# Patient Record
Sex: Male | Born: 1960 | Race: White | Hispanic: No | Marital: Single | Smoking: Never smoker
Health system: Southern US, Community
[De-identification: ages and names within clinical notes are randomized; demographics above are authoritative.]

## PROBLEM LIST (undated history)

## (undated) DIAGNOSIS — K219 Gastro-esophageal reflux disease without esophagitis: Secondary | ICD-10-CM

## (undated) DIAGNOSIS — K5792 Diverticulitis of intestine, part unspecified, without perforation or abscess without bleeding: Secondary | ICD-10-CM

## (undated) HISTORY — DX: Gastro-esophageal reflux disease without esophagitis: K21.9

## (undated) HISTORY — PX: NASAL SINUS SURGERY: SHX719

## (undated) HISTORY — PX: HERNIA REPAIR: SHX51

---

## 1995-09-04 HISTORY — PX: COLONOSCOPY: SHX174

## 2006-05-08 ENCOUNTER — Emergency Department (HOSPITAL_COMMUNITY): Admission: EM | Admit: 2006-05-08 | Discharge: 2006-05-08 | Payer: Self-pay | Admitting: Emergency Medicine

## 2009-03-26 ENCOUNTER — Emergency Department (HOSPITAL_COMMUNITY): Admission: EM | Admit: 2009-03-26 | Discharge: 2009-03-26 | Payer: Self-pay | Admitting: Emergency Medicine

## 2011-01-15 IMAGING — CR DG FOOT COMPLETE 3+V*L*
3 series · 3 of 3 positions shown · non-contrast
Comparison: None

CLINICAL DATA: Fall.  Great toe pain.

LEFT FOOT - COMPLETE 3+ VIEW

[view not recorded (1 of 3)]
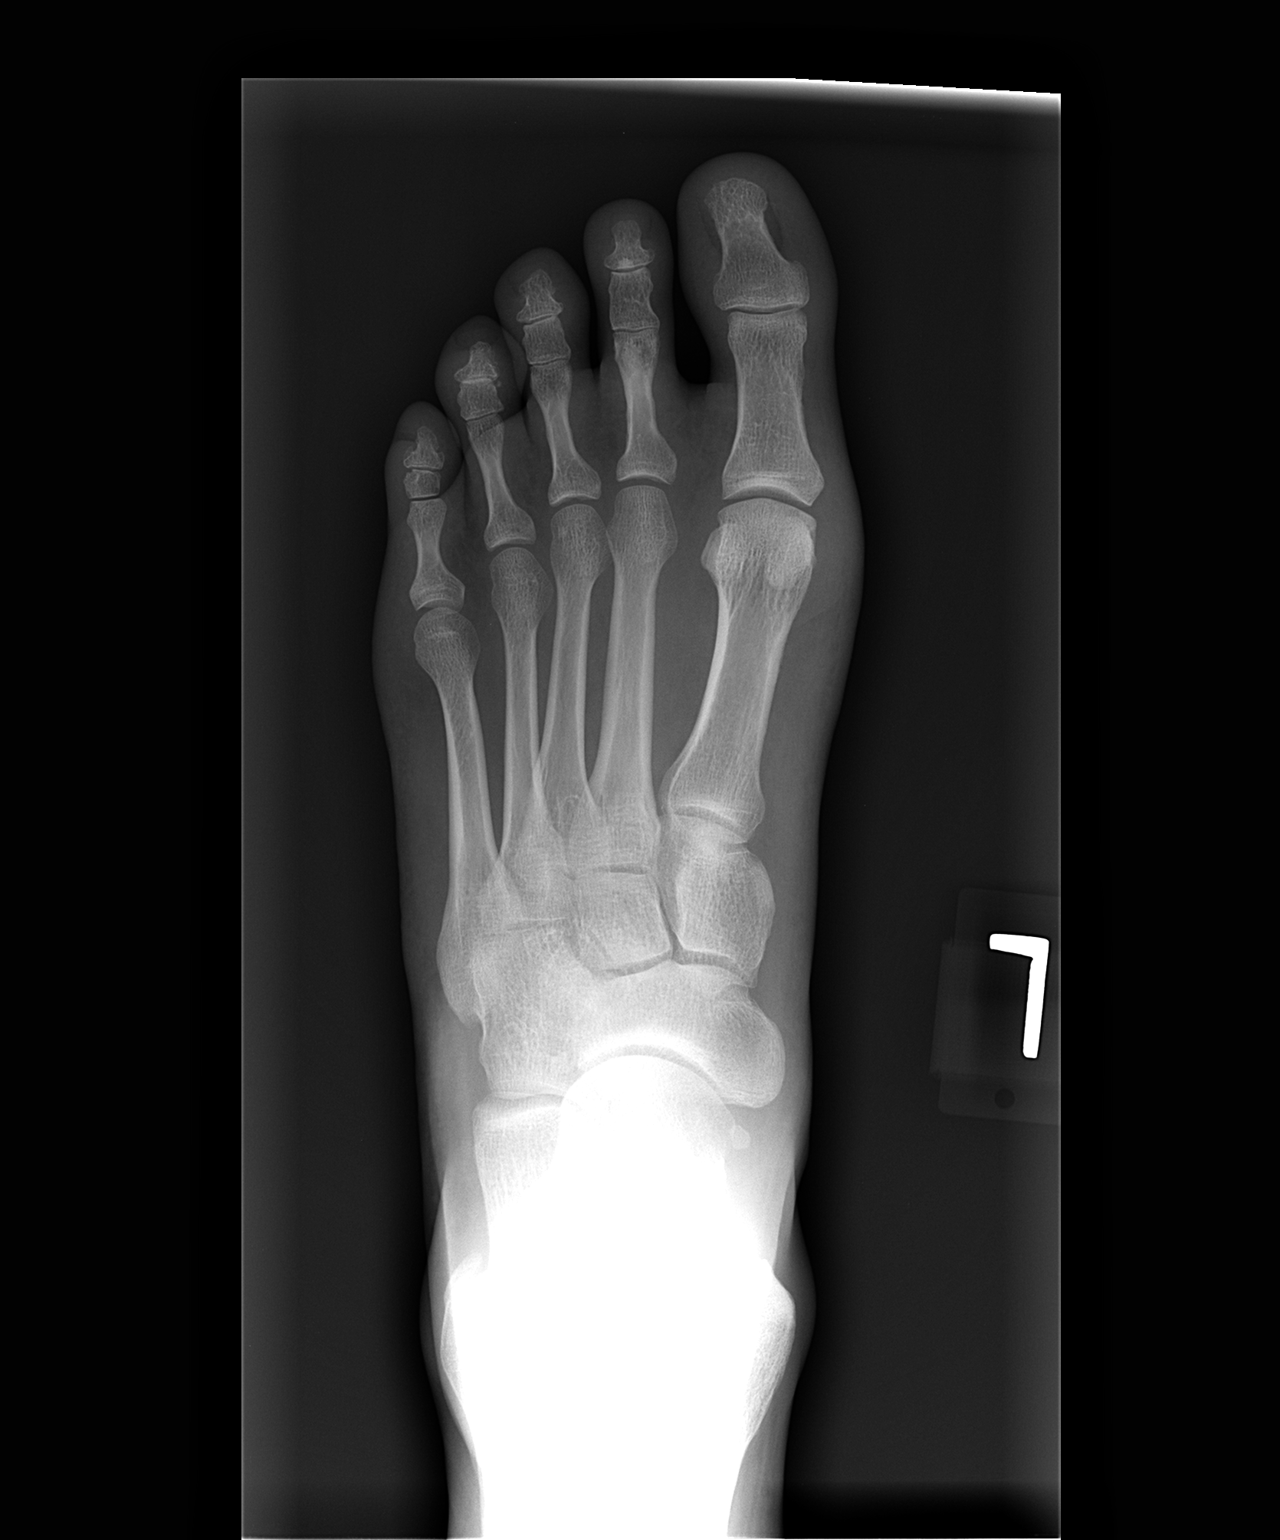

[view not recorded (2 of 3)]
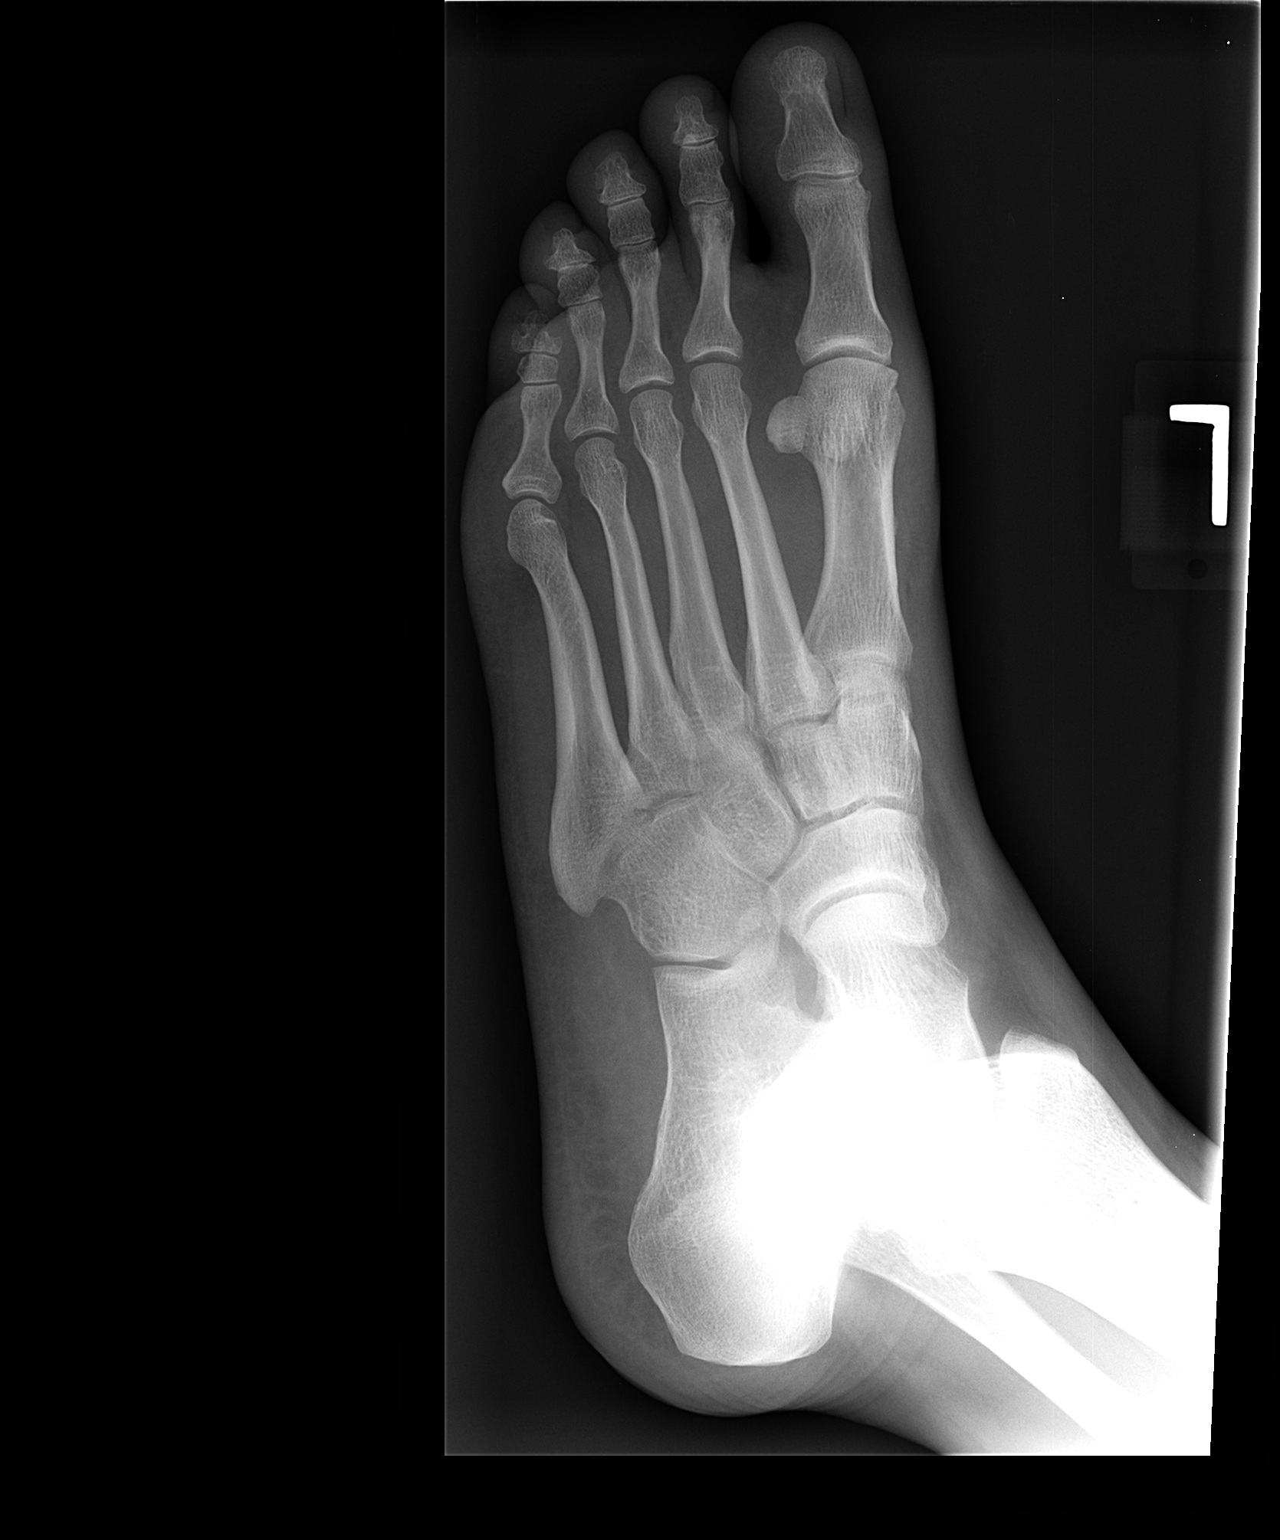

[view not recorded (3 of 3)]
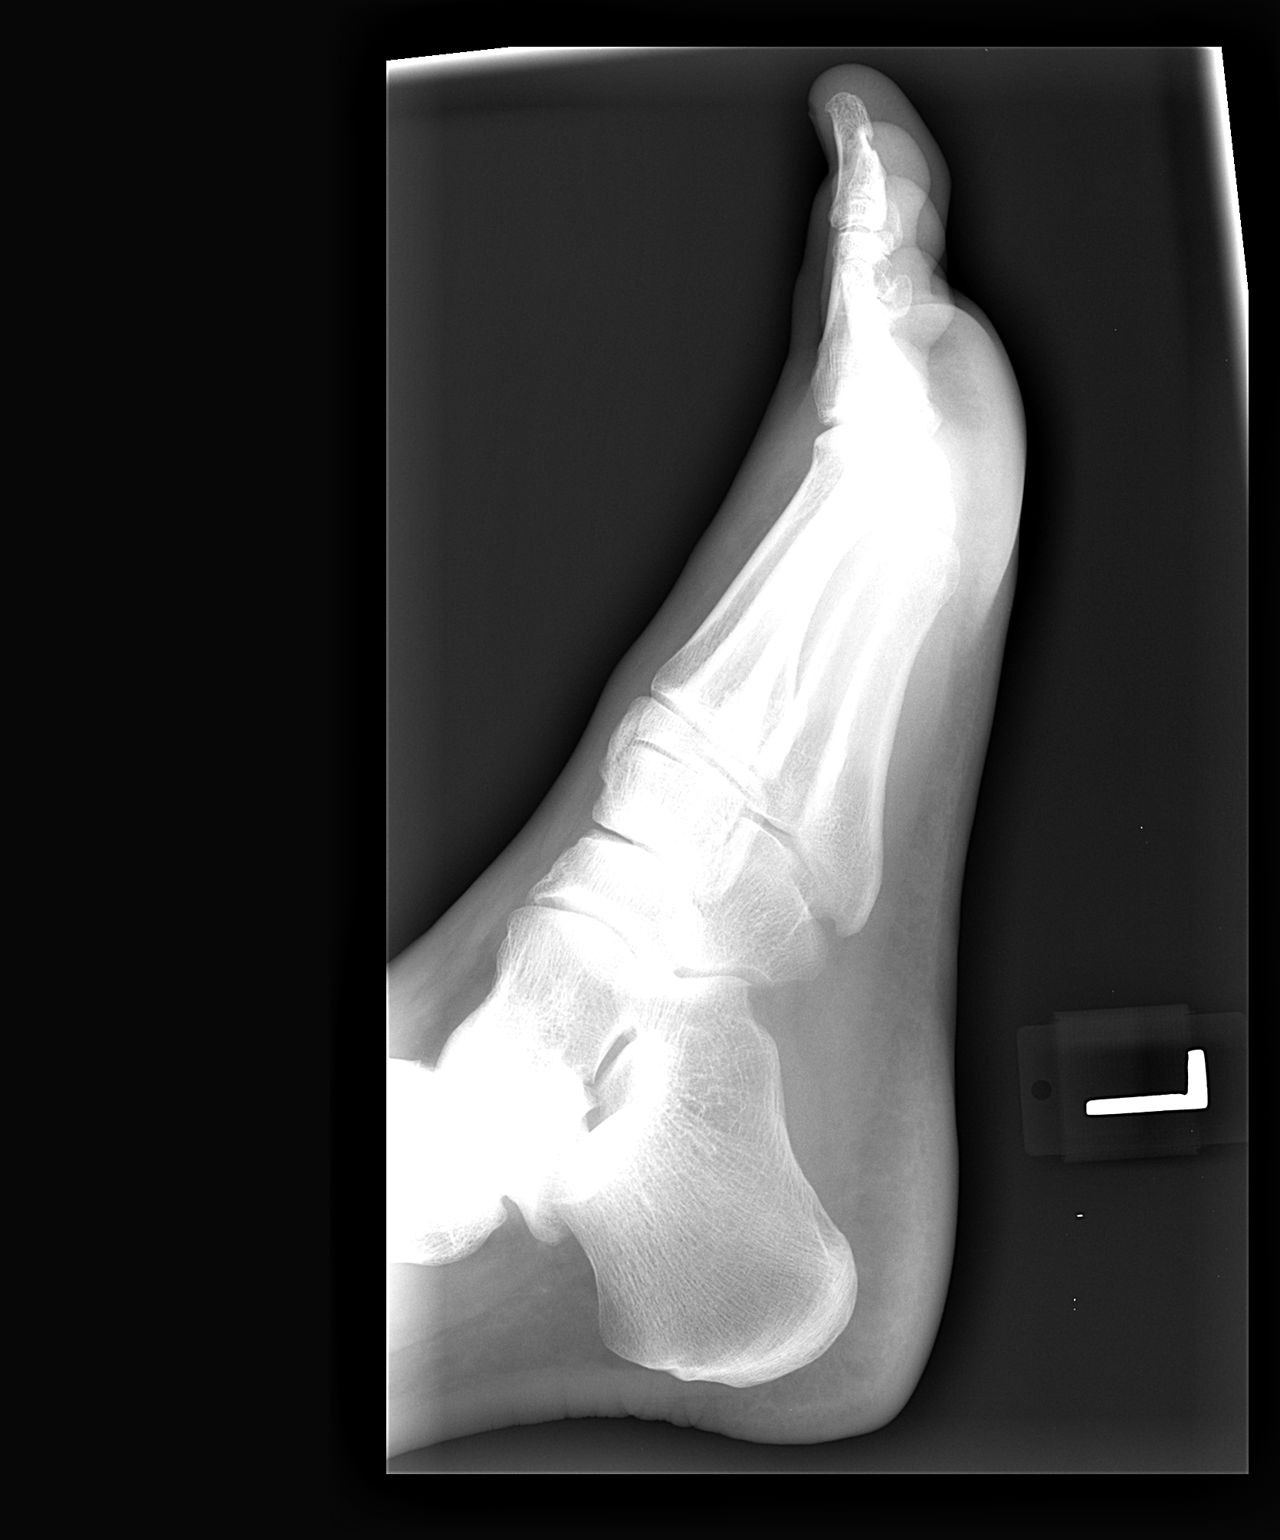

[3 of 3 positions shown; findings below may reference images not displayed]

FINDINGS: No evidence of fracture, dislocation or other focal
abnormality.
IMPRESSION: Negative

## 2013-01-28 ENCOUNTER — Encounter: Payer: Self-pay | Admitting: Gastroenterology

## 2013-02-26 ENCOUNTER — Ambulatory Visit (AMBULATORY_SURGERY_CENTER): Payer: Federal, State, Local not specified - PPO | Admitting: *Deleted

## 2013-02-26 VITALS — Ht 69.0 in | Wt 165.0 lb

## 2013-02-26 DIAGNOSIS — Z1211 Encounter for screening for malignant neoplasm of colon: Secondary | ICD-10-CM

## 2013-02-26 MED ORDER — MOVIPREP 100 G PO SOLR: 1.0000 | Freq: Once | ORAL | Status: DC

## 2013-02-26 NOTE — Progress Notes (Signed)
Denies allergies to eggs or soy products. Denies complications with sedation or anesthesia. 

## 2013-03-02 ENCOUNTER — Encounter: Payer: Self-pay | Admitting: Gastroenterology

## 2013-03-13 ENCOUNTER — Encounter: Payer: Self-pay | Admitting: Gastroenterology

## 2013-03-27 ENCOUNTER — Ambulatory Visit (AMBULATORY_SURGERY_CENTER): Payer: Federal, State, Local not specified - PPO | Admitting: Gastroenterology

## 2013-03-27 ENCOUNTER — Encounter: Payer: Self-pay | Admitting: Gastroenterology

## 2013-03-27 VITALS — BP 106/52 | HR 59 | Temp 96.5°F | Resp 88 | Ht 67.5 in | Wt 165.0 lb

## 2013-03-27 DIAGNOSIS — Z1211 Encounter for screening for malignant neoplasm of colon: Secondary | ICD-10-CM

## 2013-03-27 MED ORDER — SODIUM CHLORIDE 0.9 % IV SOLN: 500.0000 mL | INTRAVENOUS | Status: DC

## 2013-03-27 NOTE — Progress Notes (Signed)
Procedure ends, to recovery, report given and VSS. 

## 2013-03-27 NOTE — Progress Notes (Signed)
NO EGG OR SOY ALLERGY. EMW

## 2013-03-27 NOTE — Op Note (Signed)
[facility] [address] [place], [address]   COLONOSCOPY PROCEDURE REPORT  PATIENT: Jon, Fuentes  MR#: [id number] BIRTHDATE: [birth year] , 52  yrs. old GENDER: Male ENDOSCOPIST: Mardella Layman, MD, Clementeen Graham REFERRED BY:  Doreen Beam, M.D. PROCEDURE DATE:  03/27/2013 PROCEDURE:   Colonoscopy, screening ASA CLASS:   Class I INDICATIONS:average risk screening. MEDICATIONS: propofol (Diprivan) 200mg  IV  DESCRIPTION OF PROCEDURE:   After the risks and benefits and of the procedure were explained, informed consent was obtained.  A digital rectal exam revealed no abnormalities of the rectum.    The LB [id number]  endoscope was introduced through the anus and advanced to the cecum, which was identified by both the appendix and ileocecal valve .  The quality of the prep was excellent, using MoviPrep .  The instrument was then slowly withdrawn as the colon was fully examined.     COLON FINDINGS: A normal appearing cecum, ileocecal valve, and appendiceal orifice were identified.  The ascending, hepatic flexure, transverse, splenic flexure, descending, sigmoid colon and rectum appeared unremarkable.  No polyps or cancers were seen. Retroflexed views revealed no abnormalities.     The scope was then withdrawn from the patient and the procedure completed.  COMPLICATIONS: There were no complications. ENDOSCOPIC IMPRESSION: Normal colon ..no polyps noted.  RECOMMENDATIONS: 1.  Continue current medications 2.  Continue current colorectal screening recommendations for "routine risk" patients with a repeat colonoscopy in 10 years.   REPEAT EXAM:  cc:  _______________________________ eSignedMardella Layman, MD, [facility] 03/27/2013 9:47 AM

## 2013-03-27 NOTE — Progress Notes (Signed)
Patient did not experience any of the following events: a burn prior to discharge; a fall within the facility; wrong site/side/patient/procedure/implant event; or a hospital transfer or hospital admission upon discharge from the facility. (G8907) Patient did not have preoperative order for IV antibiotic SSI prophylaxis. (G8918)  

## 2013-03-27 NOTE — Patient Instructions (Addendum)
Discharge instructions given with verbal understanding. Normal exam. Resume previous medications. YOU HAD AN ENDOSCOPIC PROCEDURE TODAY AT THE [facility]: Refer to the procedure report that was given to you for any specific questions about what was found during the examination.  If the procedure report does not answer your questions, please call your gastroenterologist to clarify.  If you requested that your care partner not be given the details of your procedure findings, then the procedure report has been included in a sealed envelope for you to review at your convenience later.  YOU SHOULD EXPECT: Some feelings of bloating in the abdomen. Passage of more gas than usual.  Walking can help get rid of the air that was put into your GI tract during the procedure and reduce the bloating. If you had a lower endoscopy (such as a colonoscopy or flexible sigmoidoscopy) you may notice spotting of blood in your stool or on the toilet paper. If you underwent a bowel prep for your procedure, then you may not have a normal bowel movement for a few days.  DIET: Your first meal following the procedure should be a light meal and then it is ok to progress to your normal diet.  A half-sandwich or bowl of soup is an example of a good first meal.  Heavy or fried foods are harder to digest and may make you feel nauseous or bloated.  Likewise meals heavy in dairy and vegetables can cause extra gas to form and this can also increase the bloating.  Drink plenty of fluids but you should avoid alcoholic beverages for 24 hours.  ACTIVITY: Your care partner should take you home directly after the procedure.  You should plan to take it easy, moving slowly for the rest of the day.  You can resume normal activity the day after the procedure however you should NOT DRIVE or use heavy machinery for 24 hours (because of the sedation medicines used during the test).    SYMPTOMS TO REPORT IMMEDIATELY: A gastroenterologist  can be reached at any hour.  During normal business hours, 8:30 AM to 5:00 PM Monday through Friday, call [phone].  After hours and on weekends, please call the GI answering service at [phone] who will take a message and have the physician on call contact you.   Following lower endoscopy (colonoscopy or flexible sigmoidoscopy):  Excessive amounts of blood in the stool  Significant tenderness or worsening of abdominal pains  Swelling of the abdomen that is new, acute  Fever of 100F or higher  FOLLOW UP: If any biopsies were taken you will be contacted by phone or by letter within the next 1-3 weeks.  Call your gastroenterologist if you have not heard about the biopsies in 3 weeks.  Our staff will call the home number listed on your records the next business day following your procedure to check on you and address any questions or concerns that you may have at that time regarding the information given to you following your procedure. This is a courtesy call and so if there is no answer at the home number and we have not heard from you through the emergency physician on call, we will assume that you have returned to your regular daily activities without incident.  SIGNATURES/CONFIDENTIALITY: You and/or your care partner have signed paperwork which will be entered into your electronic medical record.  These signatures attest to the fact that that the information above on your After Visit Summary has been reviewed   and is understood.  Full responsibility of the confidentiality of this discharge information lies with you and/or your care-partner. 

## 2013-03-30 ENCOUNTER — Telehealth: Payer: Self-pay

## 2013-03-30 NOTE — Telephone Encounter (Signed)
  Follow up Call-  Call back number 03/27/2013  Post procedure Call Back phone  # [phone]  Permission to leave phone message Yes     Patient questions:  Do you have a fever, pain , or abdominal swelling? no Pain Score  0 *  Have you tolerated food without any problems? yes  Have you been able to return to your normal activities? yes  Do you have any questions about your discharge instructions: Diet   no Medications  no Follow up visit  no  Do you have questions or concerns about your Care? no  Actions: * If pain score is 4 or above: No action needed, pain <4.

## 2018-09-15 ENCOUNTER — Encounter (HOSPITAL_COMMUNITY): Payer: Self-pay | Admitting: Emergency Medicine

## 2018-09-15 ENCOUNTER — Emergency Department (HOSPITAL_COMMUNITY)
Admission: EM | Admit: 2018-09-15 | Discharge: 2018-09-15 | Disposition: A | Discharge status: Home / Self Care | Payer: Federal, State, Local not specified - PPO | Attending: Emergency Medicine | Admitting: Emergency Medicine

## 2018-09-15 ENCOUNTER — Emergency Department (HOSPITAL_COMMUNITY): Payer: Federal, State, Local not specified - PPO

## 2018-09-15 ENCOUNTER — Other Ambulatory Visit: Payer: Self-pay

## 2018-09-15 DIAGNOSIS — K219 Gastro-esophageal reflux disease without esophagitis: Secondary | ICD-10-CM | POA: Diagnosis not present

## 2018-09-15 DIAGNOSIS — R195 Other fecal abnormalities: Secondary | ICD-10-CM

## 2018-09-15 DIAGNOSIS — K529 Noninfective gastroenteritis and colitis, unspecified: Secondary | ICD-10-CM | POA: Diagnosis not present

## 2018-09-15 DIAGNOSIS — R109 Unspecified abdominal pain: Secondary | ICD-10-CM | POA: Diagnosis present

## 2018-09-15 HISTORY — DX: Diverticulitis of intestine, part unspecified, without perforation or abscess without bleeding: K57.92

## 2018-09-15 LAB — COMPREHENSIVE METABOLIC PANEL
ALBUMIN: 4 g/dL (ref 3.5–5.0)
ALK PHOS: 72 U/L (ref 38–126)
ALT: 16 U/L (ref 0–44)
AST: 18 U/L (ref 15–41)
Anion gap: 10 (ref 5–15)
BILIRUBIN TOTAL: 0.8 mg/dL (ref 0.3–1.2)
BUN: 20 mg/dL (ref 6–20)
CO2: 22 mmol/L (ref 22–32)
CREATININE: 1.19 mg/dL (ref 0.61–1.24)
Calcium: 8.9 mg/dL (ref 8.9–10.3)
Chloride: 100 mmol/L (ref 98–111)
GFR calc Af Amer: >60 mL/min (ref >60)
GFR calc non Af Amer: >60 mL/min (ref >60)
GLUCOSE: 100 mg/dL — AB (ref 70–99)
Potassium: 4.3 mmol/L (ref 3.5–5.1)
Sodium: 132 mmol/L — ABNORMAL LOW (ref 135–145)
TOTAL PROTEIN: 7.3 g/dL (ref 6.5–8.1)

## 2018-09-15 LAB — LIPASE, BLOOD: Lipase: 29 U/L (ref 11–51)

## 2018-09-15 LAB — URINALYSIS, ROUTINE W REFLEX MICROSCOPIC
BILIRUBIN URINE: NEGATIVE
Glucose, UA: NEGATIVE mg/dL
HGB URINE DIPSTICK: NEGATIVE
Ketones, ur: 20 mg/dL — AB
Leukocytes, UA: NEGATIVE
Nitrite: NEGATIVE
Protein, ur: NEGATIVE mg/dL
SPECIFIC GRAVITY, URINE: 1.018 (ref 1.005–1.030)
pH: 5 (ref 5.0–8.0)

## 2018-09-15 LAB — CBC
HCT: 51.8 % (ref 39.0–52.0)
HEMOGLOBIN: 16.1 g/dL (ref 13.0–17.0)
MCH: 26.7 pg (ref 26.0–34.0)
MCHC: 31.1 g/dL (ref 30.0–36.0)
MCV: 85.8 fL (ref 80.0–100.0)
PLATELETS: 219 10*3/uL (ref 150–400)
RBC: 6.04 MIL/uL — ABNORMAL HIGH (ref 4.22–5.81)
RDW: 13.3 % (ref 11.5–15.5)
WBC: 9.6 10*3/uL (ref 4.0–10.5)
nRBC: 0 % (ref 0.0–0.2)

## 2018-09-15 LAB — POC OCCULT BLOOD, ED: Fecal Occult Bld: POSITIVE — AB

## 2018-09-15 LAB — TROPONIN I

## 2018-09-15 MED ORDER — CIPROFLOXACIN HCL 500 MG PO TABS: 500.0000 mg | ORAL_TABLET | Freq: Two times a day (BID) | ORAL | 0 refills | Status: AC

## 2018-09-15 MED ORDER — CIPROFLOXACIN HCL 250 MG PO TABS
500.0000 mg | ORAL_TABLET | Freq: Once | ORAL | Status: AC
  Administered 2018-09-15: 500 mg via ORAL
  Filled 2018-09-15: qty 2

## 2018-09-15 MED ORDER — METRONIDAZOLE 500 MG PO TABS
500.0000 mg | ORAL_TABLET | Freq: Once | ORAL | Status: AC
  Administered 2018-09-15: 500 mg via ORAL
  Filled 2018-09-15: qty 1

## 2018-09-15 MED ORDER — METRONIDAZOLE 500 MG PO TABS: 500.0000 mg | ORAL_TABLET | Freq: Two times a day (BID) | ORAL | 0 refills | Status: AC

## 2018-09-15 MED ORDER — IOHEXOL 300 MG/ML  SOLN
100.0000 mL | Freq: Once | INTRAMUSCULAR | Status: AC | PRN
  Administered 2018-09-15: 100 mL via INTRAVENOUS

## 2018-09-15 MED ORDER — ONDANSETRON HCL 4 MG PO TABS: 4.0000 mg | ORAL_TABLET | Freq: Three times a day (TID) | ORAL | 0 refills | Status: AC | PRN

## 2018-09-15 MED ORDER — SODIUM CHLORIDE 0.9 % IV BOLUS
1000.0000 mL | Freq: Once | INTRAVENOUS | Status: AC
  Administered 2018-09-15: 1000 mL via INTRAVENOUS

## 2018-09-15 MED ORDER — ONDANSETRON HCL 4 MG/2ML IJ SOLN
4.0000 mg | Freq: Once | INTRAMUSCULAR | Status: AC
  Administered 2018-09-15: 4 mg via INTRAVENOUS
  Filled 2018-09-15: qty 2

## 2018-09-15 MED ORDER — MORPHINE SULFATE (PF) 4 MG/ML IV SOLN
4.0000 mg | Freq: Once | INTRAVENOUS | Status: AC
  Administered 2018-09-15: 4 mg via INTRAVENOUS
  Filled 2018-09-15: qty 1

## 2018-09-15 NOTE — ED Provider Notes (Signed)
Jon Fuentes   CSN: [id number] Arrival date & time: 09/15/18  1341     History   Chief Complaint Chief Complaint  Patient presents with  . Abdominal Pain    HPI Jon Fuentes is a 58 y.o. male.  He is complaining of abdominal pain and loose stools since Saturday.  He is also had some indigestion and reflux symptoms.  He rates the pain as 7 out of 10 mostly lower abdomen but there is also some upper abdominal burning discomfort.  No fevers or chills.  A little bit of nausea no vomiting.  Says his stools have been dark.  No urinary symptoms.  No recent travel no sick contacts.  He went to his PCP today who referred him here for evaluation.  The history is provided by the patient.  Abdominal Pain  Pain location:  Epigastric Pain quality: aching   Pain radiates to:  Does not radiate Pain severity now: 8/10. Onset quality:  Gradual Timing:  Constant Progression:  Unchanged Chronicity:  New Context: not sick contacts and not suspicious food intake   Relieved by:  Nothing Worsened by:  Nothing Ineffective treatments:  OTC medications Associated symptoms: diarrhea and nausea   Associated symptoms: no chest pain, no cough, no dysuria, no fever, no hematemesis, no hematochezia, no hematuria, no melena, no shortness of breath, no sore throat and no vomiting   Diarrhea:    Quality:  Semi-solid   Number of occurrences:  5   Timing:  Intermittent   Progression:  Improving   Past Medical History:  Diagnosis Date  . Diverticulitis   . GERD (gastroesophageal reflux disease)     There are no active problems to display for this patient.   Past Surgical History:  Procedure Laterality Date  . COLONOSCOPY  1997  . NASAL SINUS SURGERY          Home Medications    Prior to Admission medications   Medication Sig Start Date End Date Taking? Authorizing Provider  omeprazole (PRILOSEC) 40 MG capsule Take 40 mg by mouth as needed.    [provider]    Family History Family History  Problem Relation Age of Onset  . Colon cancer Neg Hx   . Esophageal cancer Neg Hx   . Rectal cancer Neg Hx   . Stomach cancer Neg Hx     Social History Social History   Tobacco Use  . Smoking status: Never Smoker  . Smokeless tobacco: Former Engineer, waterUser  Substance Use Topics  . Alcohol use: Yes    Alcohol/week: 4.0 standard drinks    Types: 4 Glasses of wine per week  . Drug use: No     Allergies   Patient has no known allergies.   Review of Systems Review of Systems  Constitutional: Negative for fever.  HENT: Negative for sore throat.   Eyes: Negative for visual disturbance.  Respiratory: Negative for cough and shortness of breath.   Cardiovascular: Negative for chest pain.  Gastrointestinal: Positive for abdominal pain, diarrhea and nausea. Negative for hematemesis, hematochezia, melena and vomiting.  Genitourinary: Negative for dysuria and hematuria.  Musculoskeletal: Negative for back pain.  Skin: Negative for rash.  Neurological: Negative for headaches.     Physical Exam Updated Vital Signs BP (!) 129/94 (BP Location: Right Arm)   Pulse (!) 102   Temp 97.9 F (36.6 C) (Temporal)   Resp 16   Ht 5\' 9"  (1.753 m)   Wt 79.4 kg  SpO2 97%   BMI 25.84 kg/m   Physical Exam Vitals signs and nursing Fuentes reviewed.  Constitutional:      Appearance: He is well-developed.  HENT:     Head: Normocephalic and atraumatic.  Eyes:     Conjunctiva/sclera: Conjunctivae normal.  Neck:     Musculoskeletal: Neck supple.  Cardiovascular:     Rate and Rhythm: Normal rate and regular rhythm.     Heart sounds: No murmur.  Pulmonary:     Effort: Pulmonary effort is normal. No respiratory distress.     Breath sounds: Normal breath sounds.  Abdominal:     General: Abdomen is flat.     Palpations: Abdomen is soft. There is no mass.     Tenderness: There is abdominal tenderness in the periumbilical area and left lower  quadrant. There is no guarding or rebound.  Musculoskeletal: Normal range of motion.        General: No swelling, tenderness or signs of injury.  Skin:    General: Skin is warm and dry.     Capillary Refill: Capillary refill takes less than 2 seconds.  Neurological:     General: No focal deficit present.     Mental Status: He is alert.      ED Treatments / Results  Labs (all labs ordered are listed, but only abnormal results are displayed) Labs Reviewed  COMPREHENSIVE METABOLIC PANEL - Abnormal; Notable for the following components:      Result Value   Sodium 132 (*)    Glucose, Bld 100 (*)    All other components within normal limits  CBC - Abnormal; Notable for the following components:   RBC 6.04 (*)    All other components within normal limits  URINALYSIS, ROUTINE W REFLEX MICROSCOPIC - Abnormal; Notable for the following components:   Ketones, ur 20 (*)    All other components within normal limits  POC OCCULT BLOOD, ED - Abnormal; Notable for the following components:   Fecal Occult Bld POSITIVE (*)    All other components within normal limits  LIPASE, BLOOD  TROPONIN I    EKG EKG Interpretation  Date/Time:  Monday September 15 2018 18:02:15 EST Ventricular Rate:  95 PR Interval:    QRS Duration: 97 QT Interval:  343 QTC Calculation: 432 R Axis:   -24 Text Interpretation:  Sinus rhythm Borderline left axis deviation Abnormal R-wave progression, late transition no prior to compare with Confirmed by Meridee Score [phone]) on 09/15/2018 6:14:17 PM   Radiology Ct Abdomen Pelvis W Contrast  Result Date: 09/15/2018 CLINICAL DATA:  Left lower quadrant abdominal pain for 2 days. Diarrhea. EXAM: CT ABDOMEN AND PELVIS WITH CONTRAST TECHNIQUE: Multidetector CT imaging of the abdomen and pelvis was performed using the standard protocol following bolus administration of intravenous contrast. CONTRAST:  OMNIPAQUE IOHEXOL 300 MG/ML  SOLN COMPARISON:  None. FINDINGS: Lower  chest: Dependent atelectasis in the lung bases. Hepatobiliary: No focal liver abnormality is seen. No gallstones, gallbladder wall thickening, or biliary dilatation. Pancreas: Unremarkable. No pancreatic ductal dilatation or surrounding inflammatory changes. Spleen: Normal in size without focal abnormality. Adrenals/Urinary Tract: Adrenal glands are unremarkable. Kidneys are normal, without renal calculi, focal lesion, or hydronephrosis. Bladder is unremarkable. Stomach/Bowel: Stomach is decompressed. Mildly dilated fluid-filled small bowel without transition zone. Mild diffuse small bowel wall thickening. Changes suggest enteritis, probably infectious or inflammatory. No pneumatosis or portal venous gas. Mesenteric vein and artery appear patent. Scattered stool throughout the colon. No colonic distention or wall thickening.  No evidence of diverticulitis. Appendix is normal. Vascular/Lymphatic: No significant vascular findings are present. No enlarged abdominal or pelvic lymph nodes. Reproductive: Prostate gland is enlarged, measuring 4.8 cm diameter. Other: No free air or free fluid in the abdomen. Abdominal wall musculature appears intact. Musculoskeletal: No acute or significant osseous findings. IMPRESSION: Mildly dilated fluid-filled small bowel with diffuse small bowel wall thickening. Changes likely to represent enteritis, probably infectious or inflammatory. No evidence of obstruction. Prostate gland is enlarged. Electronically Signed   By: Burman Nieves M.D.   On: 09/15/2018 19:38    Procedures Procedures (including critical care time)  Medications Ordered in ED Medications  morphine 4 MG/ML injection 4 mg (4 mg Intravenous Given 09/15/18 1814)  ondansetron (ZOFRAN) injection 4 mg (4 mg Intravenous Given 09/15/18 1814)  sodium chloride 0.9 % bolus 1,000 mL (0 mLs Intravenous Stopped 09/15/18 1937)  iohexol (OMNIPAQUE) 300 MG/ML solution 100 mL (100 mLs Intravenous Contrast Given 09/15/18 1908)    ciprofloxacin (CIPRO) tablet 500 mg (500 mg Oral Given 09/15/18 2032)  metroNIDAZOLE (FLAGYL) tablet 500 mg (500 mg Oral Given 09/15/18 2032)     Initial Impression / Assessment and Plan / ED Course  I have reviewed the triage vital signs and the nursing notes.  Pertinent labs & imaging results that were available during my care of the patient were reviewed by me and considered in my medical decision making (see chart for details).  Clinical Course as of Sep 16 1832  Mon Sep 15, 2018  2014 Chaperone was present during exam. Rectal exam normal sphincter tone no masses.  No particular stool in vault but was trace guaiac positive.  His CAT scan was read as enteritis.  He is got a normal hemoglobin and normal white count.   [MB]  2015 Think would be reasonable to cover him with antibiotics and give him GI for follow-up.  He says he thinks he had enteritis about 4 years ago at [facility] that did not have an explanation either.   [MB]    Clinical Course User Index [MB] Terrilee Files, MD    Final Clinical Impressions(s) / ED Diagnoses   Final diagnoses:  Enteritis  Guaiac positive stools    ED Discharge Orders         Ordered    ciprofloxacin (CIPRO) 500 MG tablet  2 times daily     09/15/18 2019    metroNIDAZOLE (FLAGYL) 500 MG tablet  2 times daily     09/15/18 2019    ondansetron (ZOFRAN) 4 MG tablet  Every 8 hours PRN     09/15/18 2019           Terrilee Files, MD 09/16/18 [phone]

## 2018-09-15 NOTE — Discharge Instructions (Signed)
You were seen in the emergency department for diarrhea and abdominal pain.  Your lab work was unremarkable but your CAT scan showed some small bowel thickening consistent with an enteritis.  Your rectal exam was also positive for trace blood.  We are starting you on 2 antibiotics that you will need to finish.  Please do not use alcohol when taking these medicines.  Keep well-hydrated.  Please follow-up with your doctor and we are also giving the number for a gastroenterologist in town.  Return if any worsening symptoms.

## 2018-09-15 NOTE — ED Triage Notes (Signed)
Pt reports D/ for 3 days, once today and abd pain for 2 days. Hx of diverticulitis.

## 2020-07-06 IMAGING — CT CT ABD-PELV W/ CM
2 of 5 series · 15 of 46 positions shown, 17 images · IV contrast (Isovue)
Comparison: None.

CLINICAL DATA: Left lower quadrant abdominal pain for 2 days.
Diarrhea.

EXAM:
CT ABDOMEN AND PELVIS WITH CONTRAST
TECHNIQUE: Multidetector CT imaging of the abdomen and pelvis was performed
using the standard protocol following bolus administration of
intravenous contrast.
CONTRAST:  100mL OMNIPAQUE IOHEXOL 300 MG/ML  SOLN

[Series 2: axial st · axial · 0.74mm/px · z∈[-387,+83]mm · 12 of 106 slices shown, 14 images]
[im 6/106  soft-tissue]
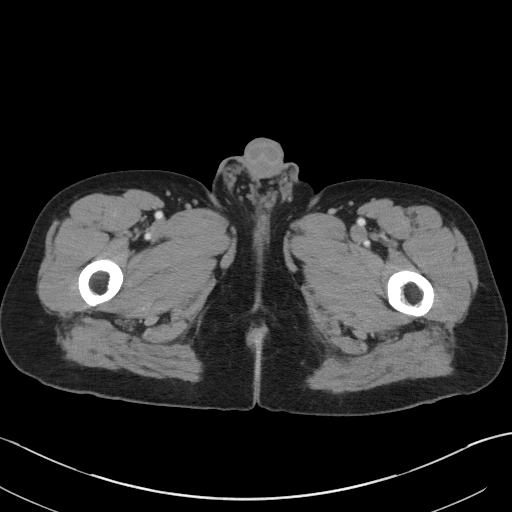
[im 6/106  bone]
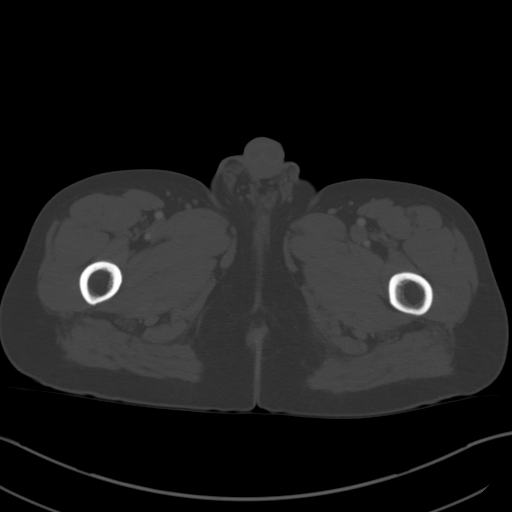
[im 16/106  soft-tissue]
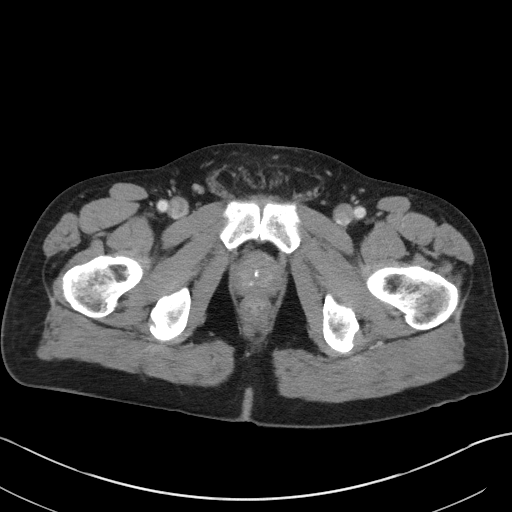
[im 22/106  soft-tissue]
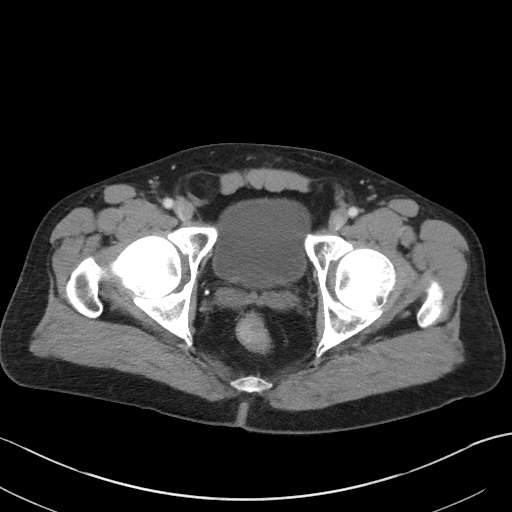
[im 32/106  soft-tissue]
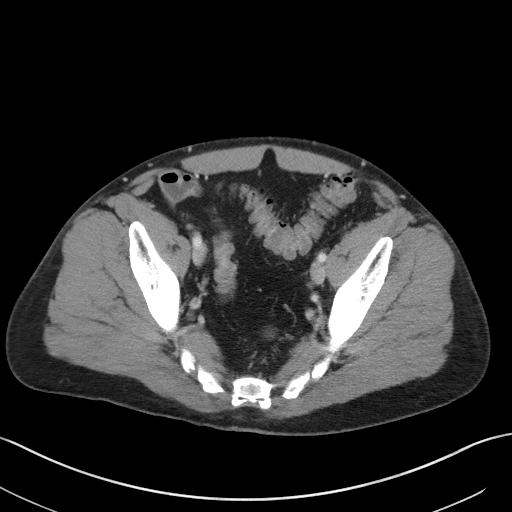
[im 43/106  soft-tissue]
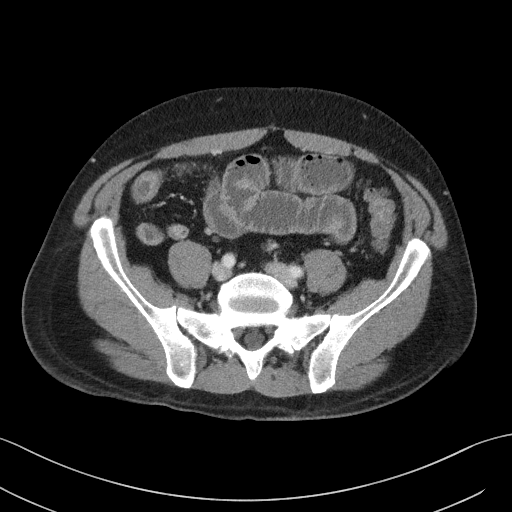
[im 48/106  soft-tissue]
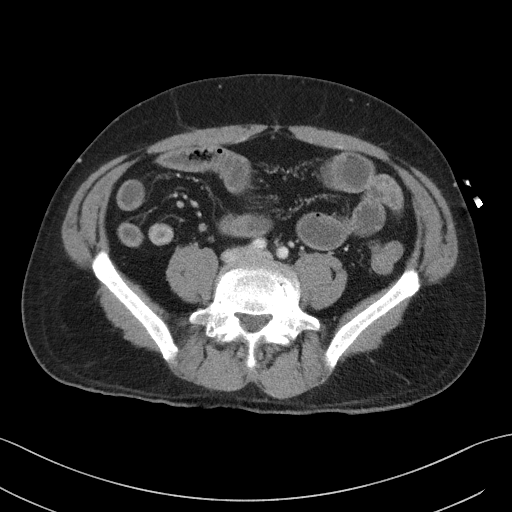
[im 58/106  soft-tissue]
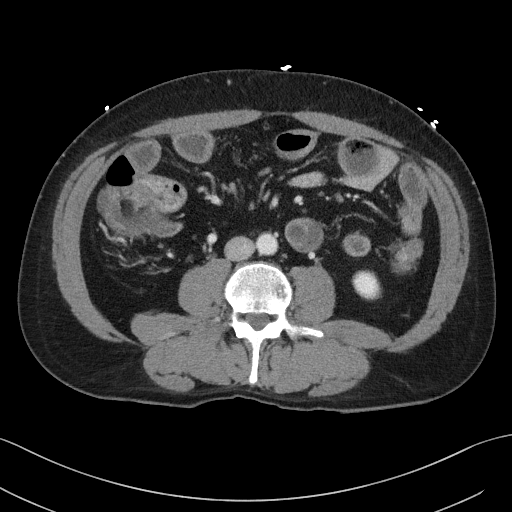
[im 64/106  soft-tissue]
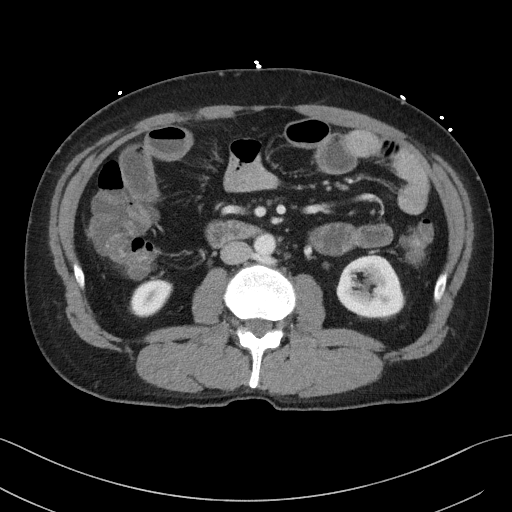
[im 74/106  soft-tissue]
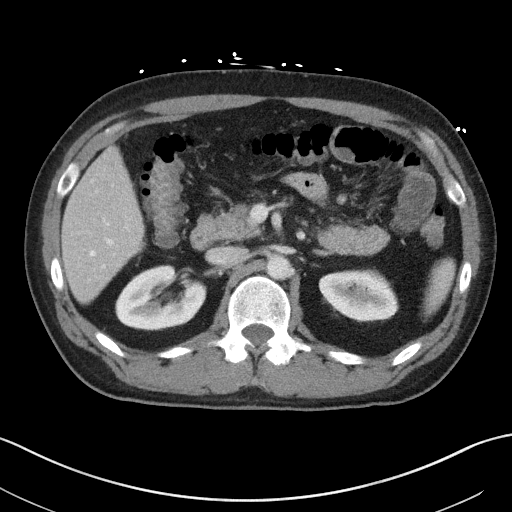
[im 74/106  bone]
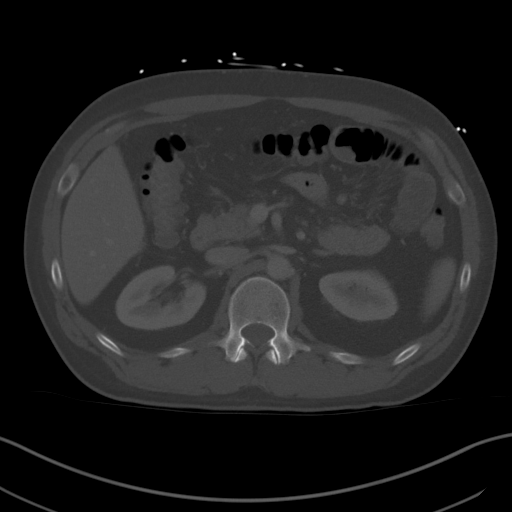
[im 85/106  soft-tissue]
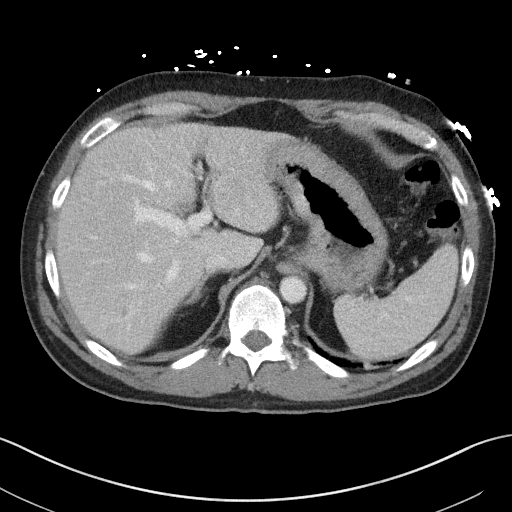
[im 90/106  soft-tissue]
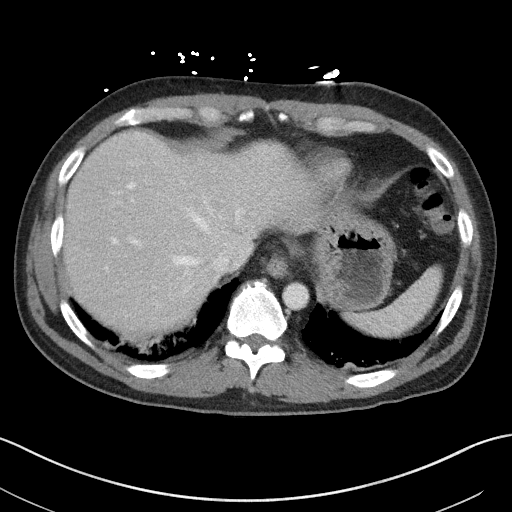
[im 100/106  soft-tissue]
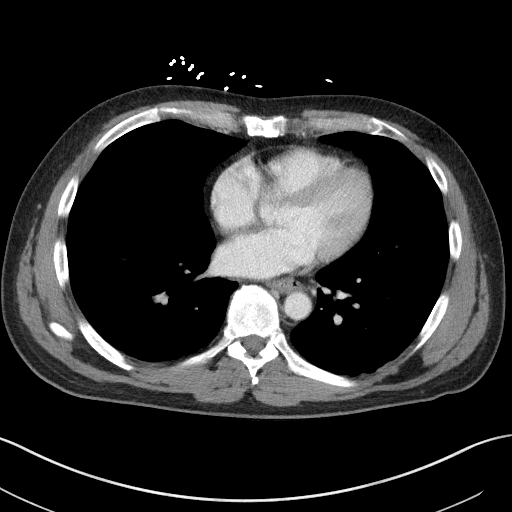

[Series 6: coronal st · coronal · 0.66mm/px · 3 of 92 slices shown]
[im 31/92  soft-tissue]
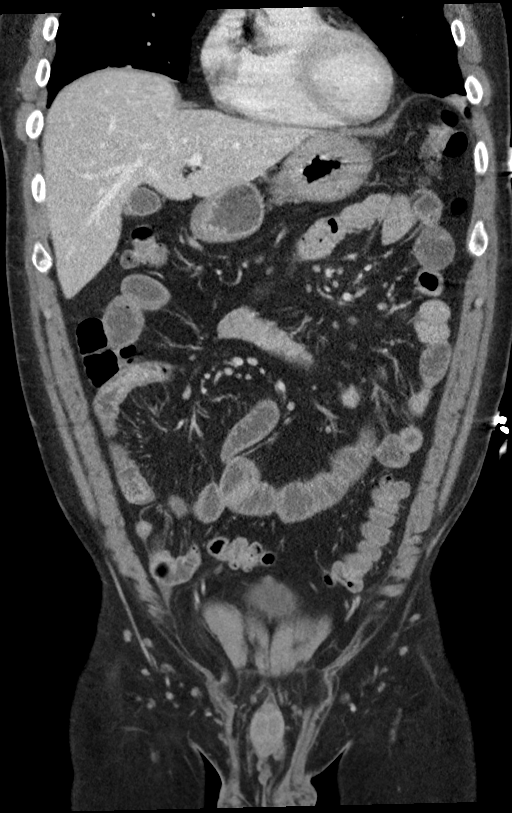
[im 41/92  soft-tissue]
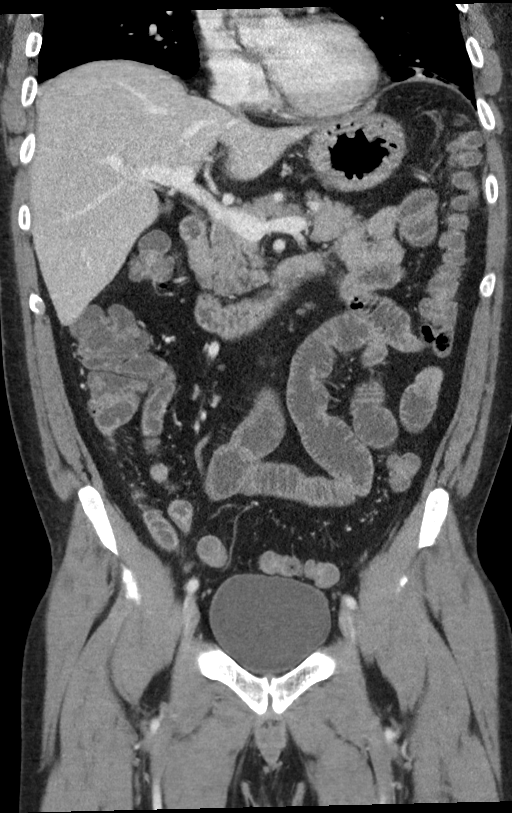
[im 51/92  soft-tissue]
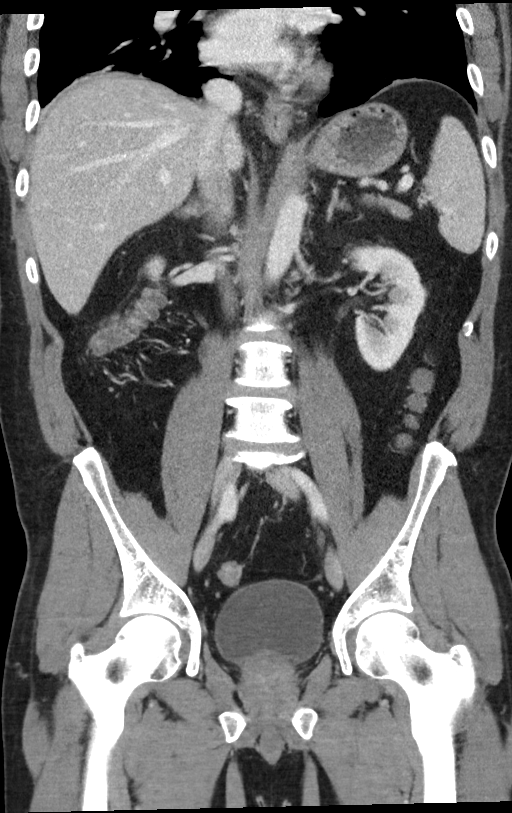

[15 of 46 positions shown; findings below may reference images not displayed]

FINDINGS: Lower chest: Dependent atelectasis in the lung bases.

Hepatobiliary: No focal liver abnormality is seen. No gallstones,
gallbladder wall thickening, or biliary dilatation.

Pancreas: Unremarkable. No pancreatic ductal dilatation or
surrounding inflammatory changes.

Spleen: Normal in size without focal abnormality.

Adrenals/Urinary Tract: Adrenal glands are unremarkable. Kidneys are
normal, without renal calculi, focal lesion, or hydronephrosis.
Bladder is unremarkable.

Stomach/Bowel: Stomach is decompressed. Mildly dilated fluid-filled
small bowel without transition zone. Mild diffuse small bowel wall
thickening. Changes suggest enteritis, probably infectious or
inflammatory. No pneumatosis or portal venous gas. Mesenteric vein
and artery appear patent. Scattered stool throughout the colon. No
colonic distention or wall thickening. No evidence of
diverticulitis. Appendix is normal.

Vascular/Lymphatic: No significant vascular findings are present. No
enlarged abdominal or pelvic lymph nodes.

Reproductive: Prostate gland is enlarged, measuring 4.8 cm diameter.

Other: No free air or free fluid in the abdomen. Abdominal wall
musculature appears intact.

Musculoskeletal: No acute or significant osseous findings.
IMPRESSION: Mildly dilated fluid-filled small bowel with diffuse small bowel
wall thickening. Changes likely to represent enteritis, probably
infectious or inflammatory. No evidence of obstruction. Prostate
gland is enlarged.

## 2020-09-09 ENCOUNTER — Encounter (HOSPITAL_COMMUNITY): Payer: Self-pay

## 2020-09-09 ENCOUNTER — Emergency Department (HOSPITAL_COMMUNITY)
Admission: EM | Admit: 2020-09-09 | Discharge: 2020-09-09 | Disposition: A | Discharge status: Home / Self Care | Payer: Federal, State, Local not specified - PPO | Attending: Emergency Medicine | Admitting: Emergency Medicine

## 2020-09-09 ENCOUNTER — Other Ambulatory Visit: Payer: Self-pay

## 2020-09-09 DIAGNOSIS — F17220 Nicotine dependence, chewing tobacco, uncomplicated: Secondary | ICD-10-CM | POA: Insufficient documentation

## 2020-09-09 DIAGNOSIS — R39198 Other difficulties with micturition: Secondary | ICD-10-CM

## 2020-09-09 DIAGNOSIS — R339 Retention of urine, unspecified: Secondary | ICD-10-CM | POA: Diagnosis present

## 2020-09-09 NOTE — Discharge Instructions (Addendum)
If at any point you again feel unable to empty your bladder, develop fevers, have large amounts of blood in your urine or have any other concerns please seek additional medical care and evaluation.

## 2020-09-09 NOTE — ED Notes (Signed)
Pt states he has been able to urinate twice

## 2020-09-09 NOTE — ED Notes (Signed)
Spouse w pt

## 2020-09-09 NOTE — ED Triage Notes (Signed)
Pt to er, pt states that he had a bilateral hernia repair today in [place], states that he is here because he can't urinate, states that he has only been able to have a few drips and feels like he really needs to go, states that his last void was this am, but he had a catheter for surgery.

## 2020-09-09 NOTE — ED Notes (Signed)
Pt had bilateral hernia repair this morning in [place]   Abd swelling a feeling of inability to void thruout the day   Dance movement psychotherapist. Who suggested an ER visit   Bladder scan shows 22 cc in bladder

## 2020-09-09 NOTE — ED Provider Notes (Signed)
[facility] EMERGENCY DEPARTMENT Provider Note   CSN: [id number] Arrival date & time: 09/09/20  2033     History Chief Complaint  Patient presents with  . Urinary Retention    Jon Fuentes is a 60 y.o. male who presents today for evaluation of concern for urinary retention.  At [facility] earlier today he had robotic bilateral inguinal hernia repair.  Review of records from that shows he had a Foley catheter at that point.  He states that since he is discharged home he has been unable to urinate.  He states that he feels a lot of pressure over his lower abdomen.  He states that he is able to get a few drips out however nothing significant.  He denies any fevers or other complaints or concerns.  Per RN bladder scan shows under 100 cc of urine.  HPI     Past Medical History:  Diagnosis Date  . Diverticulitis   . GERD (gastroesophageal reflux disease)     There are no problems to display for this patient.   Past Surgical History:  Procedure Laterality Date  . COLONOSCOPY  1997  . HERNIA REPAIR    . NASAL SINUS SURGERY         Family History  Problem Relation Age of Onset  . Colon cancer Neg Hx   . Esophageal cancer Neg Hx   . Rectal cancer Neg Hx   . Stomach cancer Neg Hx     Social History   Tobacco Use  . Smoking status: Never Smoker  . Smokeless tobacco: Former Clinical biochemist  . Vaping Use: Never used  Substance Use Topics  . Alcohol use: Yes    Alcohol/week: 4.0 standard drinks    Types: 4 Glasses of wine per week  . Drug use: No    Home Medications Prior to Admission medications   Medication Sig Start Date End Date Taking? Authorizing Provider  diclofenac Sodium (VOLTAREN) 1 % GEL Place onto the skin. 08/22/18  Yes [provider]  HYDROcodone-acetaminophen (NORCO/VICODIN) 5-325 MG tablet Take 1 tablet by mouth every 6 (six) hours as needed. 09/09/20  Yes [provider]  methocarbamol (ROBAXIN) 500 MG tablet Take 500 mg by mouth 3  (three) times daily. 09/09/20 09/14/20 Yes [provider]  Multiple Vitamins-Minerals (ONE-A-DAY MENS 50+ ADVANTAGE PO) Take 1 tablet by mouth daily.   Yes [provider]  omeprazole (PRILOSEC) 40 MG capsule Take 40 mg by mouth as needed.   Yes [provider]  polyethylene glycol powder (GLYCOLAX/MIRALAX) 17 GM/SCOOP powder Take by mouth. 09/09/20 09/12/20 Yes [provider]  cephALEXin (KEFLEX) 500 MG capsule Take 500 mg by mouth 3 (three) times daily. Patient not taking: Reported on 09/09/2020 08/04/20   [provider]  ciprofloxacin (CIPRO) 500 MG tablet Take 1 tablet (500 mg total) by mouth 2 (two) times daily. Patient not taking: Reported on 09/09/2020 09/15/18   Terrilee Files, MD  metroNIDAZOLE (FLAGYL) 500 MG tablet Take 1 tablet (500 mg total) by mouth 2 (two) times daily. Patient not taking: Reported on 09/09/2020 09/15/18   Terrilee Files, MD  ondansetron (ZOFRAN) 4 MG tablet Take 1 tablet (4 mg total) by mouth every 8 (eight) hours as needed for nausea or vomiting. 09/15/18   Terrilee Files, MD    Allergies    Patient has no known allergies.  Review of Systems   Review of Systems  Constitutional: Negative for chills and fever.  Respiratory: Negative for  shortness of breath.   Cardiovascular: Negative for chest pain.  Gastrointestinal: Positive for abdominal pain (Mild, discomfort and feelings of fullness).  Genitourinary: Positive for difficulty urinating. Negative for dysuria, frequency, penile pain and testicular pain.  Musculoskeletal: Negative for back pain and neck pain.  Neurological: Negative for weakness and headaches.  All other systems reviewed and are negative.   Physical Exam Updated Vital Signs BP 135/89 (BP Location: Right Arm)   Pulse 67   Temp 97.9 F (36.6 C) (Oral)   Resp 17   Ht 5\' 9"  (1.753 m)   Wt 81.6 kg   SpO2 98%   BMI 26.58 kg/m   Physical Exam Vitals and nursing note reviewed.  Constitutional:       General: He is not in acute distress.    Appearance: He is not diaphoretic.  HENT:     Head: Normocephalic and atraumatic.  Eyes:     General: No scleral icterus.       Right eye: No discharge.        Left eye: No discharge.     Conjunctiva/sclera: Conjunctivae normal.  Cardiovascular:     Rate and Rhythm: Normal rate and regular rhythm.  Pulmonary:     Effort: Pulmonary effort is normal. No respiratory distress.     Breath sounds: No stridor.  Abdominal:     General: There is no distension.     Tenderness: There is no abdominal tenderness. There is no guarding.     Comments: Incisions that were visualized are CDI.   Genitourinary:    Comments: Deferred, patient in the hallway and reports his symptoms have resolved Musculoskeletal:        General: No deformity.     Cervical back: Normal range of motion and neck supple.  Skin:    General: Skin is warm and dry.  Neurological:     Mental Status: He is alert.     Motor: No abnormal muscle tone.  Psychiatric:        Behavior: Behavior normal.     ED Results / Procedures / Treatments   Labs (all labs ordered are listed, but only abnormal results are displayed) Labs Reviewed - No data to display  EKG None  Radiology No results found.  Procedures Procedures (including critical care time)  Medications Ordered in ED Medications - No data to display  ED Course  I have reviewed the triage vital signs and the nursing notes.  Pertinent labs & imaging results that were available during my care of the patient were reviewed by me and considered in my medical decision making (see chart for details).    MDM Rules/Calculators/A&P                         Patient is a 60 year old man who presents today for evaluation of feelings of urinary retention. He had robotic bilateral inguinal hernia repair at [facility] earlier today where he had a catheter placed during surgery and feels like he has been unable to urinate. RN perform  bladder scan and reports under 100 cc of fluid. At the time of my evaluation patient reports that he has been able to urinate about a cup of urine twice and feels much better. I did offer him bedside ultrasound to further evaluate for bladder distention, especially as given he is recently postoperative bladder scan may be less accurate as he has reportedly urinated out more than 100 mL of fluid. Patient stated that he  felt better and declined this stating that he wished to go home instead at this time.    Return precautions were discussed with patient who states their understanding.  At the time of discharge patient denied any unaddressed complaints or concerns.  Patient is agreeable for discharge home.  Note: Portions of this report may have been transcribed using voice recognition software. Every effort was made to ensure accuracy; however, inadvertent computerized transcription errors may be present  Final Clinical Impression(s) / ED Diagnoses Final diagnoses:  Difficulty urinating    Rx / DC Orders ED Discharge Orders    None       Lorin Glass, PA-C 09/10/20 3009    Fredia Sorrow, MD 09/11/20 2141

## 2020-09-09 NOTE — ED Notes (Signed)
abd swollen and tender to touch   Slight bruising to umbilicus area   With closed incision - edges well approximated

## 2023-07-09 ENCOUNTER — Other Ambulatory Visit (HOSPITAL_COMMUNITY): Payer: Self-pay | Admitting: Orthopedic Surgery

## 2023-07-09 DIAGNOSIS — G8929 Other chronic pain: Secondary | ICD-10-CM

## 2023-07-16 ENCOUNTER — Ambulatory Visit (HOSPITAL_COMMUNITY)
Admission: RE | Admit: 2023-07-16 | Discharge: 2023-07-16 | Disposition: A | Discharge status: Home / Self Care | Payer: Federal, State, Local not specified - PPO | Source: Ambulatory Visit | Attending: Orthopedic Surgery | Admitting: Orthopedic Surgery

## 2023-07-16 DIAGNOSIS — G8929 Other chronic pain: Secondary | ICD-10-CM | POA: Diagnosis present

## 2023-07-16 DIAGNOSIS — M25561 Pain in right knee: Secondary | ICD-10-CM | POA: Diagnosis present

## 2023-07-16 DIAGNOSIS — M25562 Pain in left knee: Secondary | ICD-10-CM | POA: Diagnosis present

## 2024-02-11 ENCOUNTER — Other Ambulatory Visit (HOSPITAL_COMMUNITY): Payer: Self-pay | Admitting: Gastroenterology

## 2024-02-11 DIAGNOSIS — R1084 Generalized abdominal pain: Secondary | ICD-10-CM

## 2024-02-14 ENCOUNTER — Ambulatory Visit (HOSPITAL_COMMUNITY)
Admission: RE | Admit: 2024-02-14 | Discharge: 2024-02-14 | Disposition: A | Discharge status: Home / Self Care | Source: Ambulatory Visit | Attending: Gastroenterology | Admitting: Gastroenterology

## 2024-02-14 DIAGNOSIS — R1084 Generalized abdominal pain: Secondary | ICD-10-CM | POA: Insufficient documentation

## 2024-02-14 MED ORDER — IOHEXOL 300 MG/ML  SOLN
100.0000 mL | Freq: Once | INTRAMUSCULAR | Status: AC | PRN
  Administered 2024-02-14: 100 mL via INTRAVENOUS
